# Patient Record
Sex: Female | Born: 2000
Health system: Southern US, Community
[De-identification: ages and names within clinical notes are randomized; demographics above are authoritative.]

## PROBLEM LIST (undated history)

## (undated) DIAGNOSIS — E039 Hypothyroidism, unspecified: Secondary | ICD-10-CM

## (undated) HISTORY — PX: MULTIPLE TOOTH EXTRACTIONS: SHX2053

## (undated) HISTORY — DX: Hypothyroidism, unspecified: E03.9

---
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
## 2020-07-22 DIAGNOSIS — Z111 Encounter for screening for respiratory tuberculosis: Secondary | ICD-10-CM | POA: Diagnosis not present

## 2020-07-24 DIAGNOSIS — Z111 Encounter for screening for respiratory tuberculosis: Secondary | ICD-10-CM | POA: Diagnosis not present

## 2020-08-02 DIAGNOSIS — Z111 Encounter for screening for respiratory tuberculosis: Secondary | ICD-10-CM | POA: Diagnosis not present

## 2020-08-04 DIAGNOSIS — Z111 Encounter for screening for respiratory tuberculosis: Secondary | ICD-10-CM | POA: Diagnosis not present

## 2020-09-30 ENCOUNTER — Ambulatory Visit (INDEPENDENT_AMBULATORY_CARE_PROVIDER_SITE_OTHER): Payer: BC Managed Care – PPO | Admitting: Family Medicine

## 2020-09-30 ENCOUNTER — Other Ambulatory Visit: Payer: Self-pay

## 2020-09-30 ENCOUNTER — Encounter: Payer: Self-pay | Admitting: Family Medicine

## 2020-09-30 VITALS — BP 104/51 | HR 80 | Ht 62.6 in | Wt 111.0 lb

## 2020-09-30 DIAGNOSIS — Z113 Encounter for screening for infections with a predominantly sexual mode of transmission: Secondary | ICD-10-CM | POA: Diagnosis not present

## 2020-09-30 DIAGNOSIS — Z0000 Encounter for general adult medical examination without abnormal findings: Secondary | ICD-10-CM

## 2020-09-30 NOTE — Progress Notes (Signed)
Subjective:     Laura Irwin is a 20 y.o. female and is here for a comprehensive physical exam. The patient reports no problems. Doing well in college. 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  Plays tennis and swims.    Social History   Socioeconomic History  . Marital status: Single    Spouse name: Not on file  . Number of children: Not on file  . Years of education: Not on file  . Highest education level: Not on file  Occupational History  . Occupation: Consulting civil engineer.   Tobacco Use  . Smoking status: Never Smoker  . Smokeless tobacco: Never Used  Substance and Sexual Activity  . Alcohol use: No  . Drug use: No  . Sexual activity: Never  Other Topics Concern  . Not on file  Social History Narrative    Lives with parents and brother. Plays softball. Pitches.  She is with the [employer], 9th grade.      Social Determinants of Health   Financial Resource Strain: Not on file  Food Insecurity: Not on file  Transportation Needs: Not on file  Physical Activity: Not on file  Stress: Not on file  Social Connections: Not on file  Intimate Partner Violence: Not on file   Health Maintenance  Topic Date Due  . HIV Screening  Never done  . COVID-19 Vaccine (3 - Booster for Pfizer series) 07/25/2020  . INFLUENZA VACCINE  12/23/2020  . TETANUS/TDAP  04/28/2022  . HPV VACCINES  Completed  . Hepatitis C Screening  Completed    The following portions of the patient's history were reviewed and updated as appropriate: allergies, current medications, past family history, past medical history, past social history, past surgical history and problem list.  Review of Systems A comprehensive review of systems was negative.   Objective:    BP (!) 104/51   Pulse 80   Ht 5' 2.6" (1.59 m)   Wt 111 lb (50.3 kg)   LMP 09/16/2020 (Approximate)   SpO2 100%   BMI 19.92 kg/m  General appearance: alert, cooperative and appears stated age Head: Normocephalic, without  obvious abnormality, atraumatic Eyes: conj, clear, EOMI, PEERLA Ears: normal TM's and external ear canals both ears Nose: Nares normal. Septum midline. Mucosa normal. No drainage or sinus tenderness. Throat: lips, mucosa, and tongue normal; teeth and gums normal Neck: no adenopathy, no carotid bruit, no JVD, supple, symmetrical, trachea midline and thyroid not enlarged, symmetric, no tenderness/mass/nodules Back: symmetric, no curvature. ROM normal. No CVA tenderness. Lungs: clear to auscultation bilaterally Breasts: normal appearance, no masses or tenderness Heart: regular rate and rhythm, S1, S2 normal, no murmur, click, rub or gallop Abdomen: soft, non-tender; bowel sounds normal; no masses,  no organomegaly Extremities: extremities normal, atraumatic, no cyanosis or edema Pulses: 2+ and symmetric Skin: Skin color, texture, turgor normal. No rashes or lesions Lymph nodes: Cervical, supraclavicular, and axillary nodes normal. Neurologic: Alert and oriented X 3, normal strength and tone. Normal symmetric reflexes. Normal coordination and gait    Assessment:    Healthy female exam.      Plan:     See After Visit Summary for Counseling Recommendations   Keep up a regular exercise program and make sure you are eating a healthy diet Try to eat 4 servings of dairy a day, or if you are lactose intolerant take a calcium with vitamin D daily.  Your vaccines are up to date.  Discussed birth  control options.

## 2020-09-30 NOTE — Patient Instructions (Addendum)

## 2020-10-01 LAB — C. TRACHOMATIS/N. GONORRHOEAE RNA
C. trachomatis RNA, TMA: NOT DETECTED
N. gonorrhoeae RNA, TMA: NOT DETECTED

## 2020-10-07 ENCOUNTER — Other Ambulatory Visit: Payer: Self-pay

## 2020-10-07 ENCOUNTER — Encounter: Payer: Self-pay | Admitting: Family Medicine

## 2020-10-07 ENCOUNTER — Ambulatory Visit (INDEPENDENT_AMBULATORY_CARE_PROVIDER_SITE_OTHER): Payer: BC Managed Care – PPO

## 2020-10-07 ENCOUNTER — Ambulatory Visit: Payer: BC Managed Care – PPO | Admitting: Family Medicine

## 2020-10-07 VITALS — BP 125/68 | HR 67 | Ht 63.0 in | Wt 111.0 lb

## 2020-10-07 DIAGNOSIS — M25562 Pain in left knee: Secondary | ICD-10-CM

## 2020-10-07 NOTE — Progress Notes (Signed)
Acute Office Visit  Subjective:    Patient ID: Laura Irwin, female    DOB: 2000/11/13, 20 y.o.   MRN: [id number]  Chief Complaint  Patient presents with  . Knee Pain    HPI Patient is in today for left knee pain x 1 week. Started last Tuesday.  She said she was running on the treadmill which she usually does for about 15 minutes when she is exercising and suddenly felt a pop.  She it was a little bit uncomfortable but she continued and finished her workout and then later that evening she was playing sand court volleyball and says it kept popping during the play.  She says since then its been more sore and tender and bothersome mostly underneath the kneecap over the patellar tendon area.  She says Friday and Saturday it was actually really painful she wore an old brace soft brace that she had at home.  And she has been trying to elevate it she is been taking Aleve once a day.  Says the knee feels worse if she tries to run on it or if she tries to straighten it she says she feels most like it wants to lock.  .   Past Medical History:  Diagnosis Date  . Hypothyroidism     Past Surgical History:  Procedure Laterality Date  . MULTIPLE TOOTH EXTRACTIONS      Family History  Problem Relation Age of Onset  . Thyroid disease Maternal Grandmother   . Thyroid disease Maternal Aunt     Social History   Socioeconomic History  . Marital status: Single    Spouse name: Not on file  . Number of children: Not on file  . Years of education: Not on file  . Highest education level: Not on file  Occupational History  . Occupation: Consulting civil engineer.   Tobacco Use  . Smoking status: Never Smoker  . Smokeless tobacco: Never Used  Substance and Sexual Activity  . Alcohol use: No  . Drug use: No  . Sexual activity: Never  Other Topics Concern  . Not on file  Social History Narrative    Lives with parents and brother. Plays softball. Pitches.  She is with the [employer], 9th grade.      Social  Determinants of Health   Financial Resource Strain: Not on file  Food Insecurity: Not on file  Transportation Needs: Not on file  Physical Activity: Not on file  Stress: Not on file  Social Connections: Not on file  Intimate Partner Violence: Not on file    Outpatient Medications Prior to Visit  Medication Sig Dispense Refill  . levothyroxine (SYNTHROID) 125 MCG tablet Take 1 tablet (125 mcg total) by mouth daily. 90 tablet 3   No facility-administered medications prior to visit.    Allergies  Allergen Reactions  . Amoxicillin   . Cephalosporins   . Minocycline Rash    Hives    Review of Systems     Objective:    Physical Exam Vitals reviewed.  Constitutional:      Appearance: She is well-developed.  HENT:     Head: Normocephalic and atraumatic.  Eyes:     Conjunctiva/sclera: Conjunctivae normal.  Cardiovascular:     Rate and Rhythm: Normal rate.  Pulmonary:     Effort: Pulmonary effort is normal.  Musculoskeletal:     Comments: Mild swelling below the patella over the patellar tendon are.  Tender rectally over the tenderness.  Nontender over the medial lateral  joint lines.  No increased laxity.  Negative McMurray's.  Strength is 5 out of 5 at the hip knee and ankle.  No increased laxity with varus or valgus stress.  Skin:    General: Skin is dry.     Coloration: Skin is not pale.  Neurological:     Mental Status: She is alert and oriented to person, place, and time.  Psychiatric:        Behavior: Behavior normal.     BP 125/68   Pulse 67   Ht 5\' 3"  (1.6 m)   Wt 111 lb (50.3 kg)   LMP 09/16/2020 (Approximate)   SpO2 98%   BMI 19.66 kg/m  Wt Readings from Last 3 Encounters:  10/07/20 111 lb (50.3 kg) (17 %, Z= -0.94)*  09/30/20 111 lb (50.3 kg) (17 %, Z= -0.94)*  12/25/19 106 lb (48.1 kg) (11 %, Z= -1.22)*   * Growth percentiles are based on CDC (Girls, 2-20 Years) data.    Health Maintenance Due  Topic Date Due  . HIV Screening  Never done  .  COVID-19 Vaccine (3 - Booster for Pfizer series) 06/27/2020    There are no preventive care reminders to display for this patient.   Lab Results  Component Value Date   TSH 3.85 12/15/2019   Lab Results  Component Value Date   WBC 5.1 06/14/2017   HGB 13.3 06/14/2017   HCT 39.5 06/14/2017   MCV 85.9 06/14/2017   PLT 303 06/14/2017   Lab Results  Component Value Date   NA 139 09/25/2011   K 3.6 09/25/2011   CO2 26 09/25/2011   GLUCOSE 67 (L) 09/25/2011   BUN 21 09/25/2011   CREATININE 1.05 09/25/2011   BILITOT 0.7 10/08/2011   ALKPHOS 68 10/08/2011   AST 38 (H) 10/08/2011   ALT 22 10/08/2011   PROT 7.5 10/08/2011   ALBUMIN 4.8 10/08/2011   CALCIUM 9.6 09/25/2011   No results found for: CHOL No results found for: HDL No results found for: LDLCALC No results found for: TRIG No results found for: CHOLHDL Lab Results  Component Value Date   HGBA1C 5.2 10/28/2012       Assessment & Plan:   Problem List Items Addressed This Visit   None   Visit Diagnoses    Acute pain of left knee    -  Primary   Relevant Orders   DG Knee Complete 4 Views Left     Left knee pain-normal range of motion, strength is normal.  No significant crepitus.  No significant laxity of the patella.  We will get x-ray today just to make sure that we do not see anything unusual.  She is never had any problems with this knee before.  Suspect patellar tendinitis.  Given handout on exercises to do on her own at home recommend icing for 5 to 10 minutes 3 times a day.  Increase Aleve to twice a day for the next week.  If not improving then will get in with sports med and or orthopedist.  She can continue to wear the brace especially if it feels more comfortable and supportive.  No orders of the defined types were placed in this encounter.    12/28/2012, MD

## 2020-10-07 NOTE — Patient Instructions (Signed)
Take Aleve twice a day for one week.   Ice for 5-10 min up to 3 times a day.   Work on exercises as tolerated.

## 2020-10-07 NOTE — Progress Notes (Signed)
Pt state that on Tuesday she was running on the treadmill for about 20 mins and her L knee began to hurt but she continued to finish her workout. Later that evening she played sand volleyball and her L Knee continued to pop and bother her. She has been taking Advil and using a soft brace for comfort.

## 2021-01-10 ENCOUNTER — Telehealth (INDEPENDENT_AMBULATORY_CARE_PROVIDER_SITE_OTHER): Payer: Self-pay | Admitting: Pediatric Endocrinology

## 2021-01-10 DIAGNOSIS — E063 Autoimmune thyroiditis: Secondary | ICD-10-CM

## 2021-01-10 NOTE — Telephone Encounter (Signed)
  Who's calling (name and relationship to patient) : Laura Irwin - self  Best contact number: [phone]  Provider they see: Dr. Vanessa Wilton  Reason for call: Patient has appointment next week and wants to make sure that her lab orders are placed for [facility].     PRESCRIPTION REFILL ONLY  Name of prescription:  Pharmacy:

## 2021-01-11 ENCOUNTER — Encounter (INDEPENDENT_AMBULATORY_CARE_PROVIDER_SITE_OTHER): Payer: Self-pay

## 2021-01-13 ENCOUNTER — Ambulatory Visit (INDEPENDENT_AMBULATORY_CARE_PROVIDER_SITE_OTHER): Payer: BC Managed Care – PPO | Admitting: Pediatric Endocrinology

## 2021-01-13 NOTE — Telephone Encounter (Signed)
TSH, free T4, Total T4. Thanks!

## 2021-01-13 NOTE — Telephone Encounter (Signed)
Labs ordered and a message has been sent via Mychart.

## 2021-01-14 DIAGNOSIS — E063 Autoimmune thyroiditis: Secondary | ICD-10-CM | POA: Diagnosis not present

## 2021-01-15 LAB — T4, FREE: Free T4: 1.7 ng/dL — ABNORMAL HIGH (ref 0.8–1.4)

## 2021-01-15 LAB — T4: T4, Total: 10.9 ug/dL (ref 5.3–11.7)

## 2021-01-15 LAB — TSH: TSH: 2.37 mIU/L

## 2021-01-16 ENCOUNTER — Encounter (INDEPENDENT_AMBULATORY_CARE_PROVIDER_SITE_OTHER): Payer: Self-pay | Admitting: Pediatric Endocrinology

## 2021-01-16 ENCOUNTER — Ambulatory Visit (INDEPENDENT_AMBULATORY_CARE_PROVIDER_SITE_OTHER): Payer: BC Managed Care – PPO | Admitting: Pediatric Endocrinology

## 2021-01-16 ENCOUNTER — Other Ambulatory Visit: Payer: Self-pay

## 2021-01-16 VITALS — BP 102/60 | HR 78 | Wt 106.2 lb

## 2021-01-16 DIAGNOSIS — E063 Autoimmune thyroiditis: Secondary | ICD-10-CM

## 2021-01-16 MED ORDER — LEVOTHYROXINE SODIUM 125 MCG PO TABS: 125.0000 ug | ORAL_TABLET | Freq: Every day | ORAL | 3 refills | Status: DC

## 2021-01-16 NOTE — Progress Notes (Signed)
Subjective:  Subjective  Patient Name: Laura Irwin Date of Birth: 12/02/2000  MRN: [id number]  Laura Irwin  presents to the office today for follow-up evaluation and management of her  hypothyroidism and growth delay.  HISTORY OF PRESENT ILLNESS:   Laura Irwin is a 20 y.o. Caucasian female.  Laura Irwin was unaccompanied  1. Laura Irwin was diagnosed with hypothyroidism in May 2013 after presenting with vomiting, jaundice, and swelling of her neck and face, found to have TSH elevated >1033mIU/mL. At that time, she was started on Synthroid at that time. Her TPO antibodies were positive at 92.2. Laura Irwin had slowing of linear growth since age 36.    2. The patient's last [facility] visit was on 12/25/19 . In the interim, she has been generally healthy.    She has finished her first year at [employer]. 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   She continues on Synthroid 125 mcg daily. She denies missing doses. She usually takes it in the morning. She takes her vitamins at night.   She has continued with regular periods.   No changes with hair or skin.  No issues with diarrhea or constipation.  She is always cold. - not as bad but thinks that she will always just be cold.  Weight has been stable.   She is no longer receiving name brand Synthroid. She is taking generic levothyroxine.   She is no longer taking OCP.  She didn't like how it made her feel.   3. Pertinent Review of Systems:  Constitutional: The patient feels "good". The patient seems healthy and active. Eyes: Vision seems to be good. There are no recognized eye problems. Wears glasses to see the board at school- is wearing them more often.  Neck: The patient has no complaints of anterior neck swelling, soreness, tenderness, pressure, discomfort, or difficulty swallowing.   Heart: Heart rate increases with exercise or other physical activity. The patient has no  complaints of palpitations, irregular heart beats, chest pain, or chest pressure.   Gastrointestinal: Has problems with constipation, using Miralax irregularly. The patient has no complaints of excessive hunger, acid reflux, upset stomach, or diarrhea.  Legs: Muscle mass and strength seem normal. There are no complaints of numbness, tingling, burning, or pain. No edema is noted.  Feet: There are no obvious foot problems. There are no complaints of numbness, tingling, burning, or pain. No edema is noted. Neurologic: There are no recognized problems with muscle movement and strength, sensation, or coordination. GYN/GU: per HPI. LMP 8/20  PAST MEDICAL, FAMILY, AND SOCIAL HISTORY  Past Medical History:  Diagnosis Date   Hypothyroidism     Family History  Problem Relation Age of Onset   Thyroid disease Maternal Grandmother    Thyroid disease Maternal Aunt      Current Outpatient Medications:    levothyroxine (SYNTHROID) 125 MCG tablet, Take 1 tablet (125 mcg total) by mouth daily., Disp: 90 tablet, Rfl: 3  Allergies as of 01/16/2021 - Review Complete 01/16/2021  Allergen Reaction Noted   Amoxicillin     Cephalosporins     Minocycline Rash 06/09/2016     reports that she has never smoked. She has never used smokeless tobacco. She reports that she does not drink alcohol and does not use drugs. Pediatric History  Patient Parents   Marik,[name]   Other Topics Concern   Not on file  Social History Narrative  Lives with parents and brother.    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  Life guard and tennis Primary Care Provider: Agapito Games, MD  ROS: There are no other significant problems involving Laura Irwin's other body systems.    Objective:  Objective  Vital Signs:   BP 102/60   Pulse 78   Wt 106 lb 3.2 oz (48.2 kg)   BMI 18.81 kg/m  Blood pressure percentiles are not available for patients who are 18 years or  older.   Ht Readings from Last 3 Encounters:  10/07/20 5\' 3"  (1.6 m) (31 %, Z= -0.51)*  09/30/20 5' 2.6" (1.59 m) (25 %, Z= -0.66)*  06/21/19 5' 2.99" (1.6 m) (31 %, Z= -0.49)*   * Growth percentiles are based on CDC (Girls, 2-20 Years) data.   Wt Readings from Last 3 Encounters:  01/16/21 106 lb 3.2 oz (48.2 kg) (10 %, Z= -1.31)*  10/07/20 111 lb (50.3 kg) (17 %, Z= -0.94)*  09/30/20 111 lb (50.3 kg) (17 %, Z= -0.94)*   * Growth percentiles are based on CDC (Girls, 2-20 Years) data.   HC Readings from Last 3 Encounters:  No data found for Laura Irwin   Body surface area is 1.46 meters squared. No height on file for this encounter. 10 %ile (Z= -1.31) based on CDC (Girls, 2-20 Years) weight-for-age data using vitals from 01/16/2021.   PHYSICAL EXAM:  Constitutional: The patient appears healthy and well nourished. The patient's height and weight are normal. Weight is down 5 pounds.  Head: The head is normocephalic. Face: The face appears normal. There are no obvious dysmorphic features. Eyes: The eyes appear to be normally formed and spaced. Gaze is conjugate. There is no obvious arcus or proptosis. Moisture appears normal. Ears: The ears are normally placed and appear externally normal. Mouth: The oropharynx and tongue appear normal. Dentition appears to be delayed for age. Oral moisture is normal. Neck: The neck appears to be visibly normal. The consistency of the thyroid gland is normal. The thyroid gland is not tender to palpation. Lungs: The lungs are clear to auscultation. Air movement is good. Heart: Heart rate and rhythm are regular. Heart sounds S1 and S2 are normal. I did not appreciate any pathologic cardiac murmurs. Abdomen: The abdomen appears to be normal in size for the patient's age. Bowel sounds are normal. There is no obvious hepatomegaly, splenomegaly, or other mass effect.  Arms: Muscle size and bulk are normal for age. Hypopigmented area on right wrist. Hands: There is no  obvious tremor. Phalangeal and metacarpophalangeal joints are normal. Palmar muscles are normal for age. Palmar skin is normal. Palmar moisture is also normal. Legs: Muscles appear normal for age. No edema is present. Feet: Feet are normally formed. Dorsalis pedal pulses are normal. Neurologic: Strength is normal for age in both the upper and lower extremities. Muscle tone is normal. Sensation to touch is normal in both the legs and feet.   GYN/GU: Normal.   LAB DATA:    Results for orders placed or performed in visit on 01/10/21  T4, free  Result Value Ref Range   Free T4 1.7 (H) 0.8 - 1.4 ng/dL  TSH  Result Value Ref Range   TSH 2.37 mIU/L  T4  Result Value Ref Range   T4, Total 10.9 5.3 - 11.7 mcg/dL       Assessment and Plan:   ASSESSMENT: Starlee is a 20 y.o. female referred  for growth delay and hypothyroidism.   Hypothyroidism - Clinically euthyroid on 125 of levothyroxine daily - Chemically appears slightly over treated with normal TSH and just above normal free T4 - as she is clinically not hyperthyroid and labs are stable from last year will continue current dose  PLAN:  1. Diagnostic: Labs as above. Repeat TFTs prior to next visit  2. Therapeutic: Continue levothyroxine 125 daily.  3. Patient education:  Discussion as above.   4. Follow-up: Return in about 1 year (around 01/16/2022).      Dessa Phi, MD  Level of Service: Level 3

## 2021-09-18 DIAGNOSIS — Z2089 Contact with and (suspected) exposure to other communicable diseases: Secondary | ICD-10-CM | POA: Diagnosis not present

## 2021-10-07 ENCOUNTER — Encounter: Payer: BC Managed Care – PPO | Admitting: Family Medicine

## 2021-10-10 ENCOUNTER — Telehealth (INDEPENDENT_AMBULATORY_CARE_PROVIDER_SITE_OTHER): Payer: Self-pay | Admitting: Pediatric Endocrinology

## 2021-10-10 DIAGNOSIS — E063 Autoimmune thyroiditis: Secondary | ICD-10-CM

## 2021-10-10 NOTE — Telephone Encounter (Signed)
Spoke with Laura Irwin  She is having sweating, anxiety, palpitations, trouble sleeping.  Will order repeat thyroid labs Will have a virtual visit early next week to review results and treat if indicated.   Lelon Huh, MD

## 2021-10-10 NOTE — Telephone Encounter (Signed)
  Name of who is calling:Selkirk   Caller's Relationship to Patient:Self   Best contact number:[phone]  Provider they see:Dr.Badik   Reason for call:Caller stated that she has not been feeling well and that she is having a lot of anxiety and feeling on edge. She stated that it is interfering with school and sleep. Eola has requested a call back to discuss this and to see if orders could be sent to the [facility] in [place] for her labs to be taken and to also have her thyroid checked. Please advise     PRESCRIPTION REFILL ONLY  Name of prescription:  Pharmacy:

## 2021-10-13 ENCOUNTER — Telehealth (INDEPENDENT_AMBULATORY_CARE_PROVIDER_SITE_OTHER): Payer: Self-pay

## 2021-10-13 ENCOUNTER — Encounter: Payer: BC Managed Care – PPO | Admitting: Family Medicine

## 2021-10-13 NOTE — Telephone Encounter (Signed)
  Name of who is calling: Tracey  Caller's Relationship to Patient: Mother  Best contact number: [phone] (Mother) [phone] Normand Sloop)  Provider they see: Baldo Ash  Reason for call: Patient mother called to see if patient had to wait on med adjustment for appt in June as she is in nursing school classes all day and that was the first available to fit her school schedule. Patient is still feeling bad.     PRESCRIPTION REFILL ONLY  Name of prescription:  Pharmacy:

## 2021-10-15 LAB — COMPREHENSIVE METABOLIC PANEL
AG Ratio: 1.8 (calc) (ref 1.0–2.5)
ALT: 15 U/L (ref 6–29)
AST: 14 U/L (ref 10–30)
Albumin: 4.6 g/dL (ref 3.6–5.1)
Alkaline phosphatase (APISO): 50 U/L (ref 31–125)
BUN: 17 mg/dL (ref 7–25)
CO2: 27 mmol/L (ref 20–32)
Calcium: 9.7 mg/dL (ref 8.6–10.2)
Chloride: 105 mmol/L (ref 98–110)
Creat: 0.93 mg/dL (ref 0.50–0.96)
Globulin: 2.6 g/dL (calc) (ref 1.9–3.7)
Glucose, Bld: 54 mg/dL — ABNORMAL LOW (ref 65–139)
Potassium: 3.9 mmol/L (ref 3.5–5.3)
Sodium: 141 mmol/L (ref 135–146)
Total Bilirubin: 0.9 mg/dL (ref 0.2–1.2)
Total Protein: 7.2 g/dL (ref 6.1–8.1)

## 2021-10-15 LAB — CBC WITH DIFFERENTIAL/PLATELET
Absolute Monocytes: 442 cells/uL (ref 200–950)
Basophils Absolute: 27 cells/uL (ref 0–200)
Basophils Relative: 0.4 %
Eosinophils Absolute: 147 cells/uL (ref 15–500)
Eosinophils Relative: 2.2 %
HCT: 41.8 % (ref 35.0–45.0)
Hemoglobin: 13.9 g/dL (ref 11.7–15.5)
Lymphs Abs: 2760 cells/uL (ref 850–3900)
MCH: 29.3 pg (ref 27.0–33.0)
MCHC: 33.3 g/dL (ref 32.0–36.0)
MCV: 88 fL (ref 80.0–100.0)
MPV: 12.3 fL (ref 7.5–12.5)
Monocytes Relative: 6.6 %
Neutro Abs: 3323 cells/uL (ref 1500–7800)
Neutrophils Relative %: 49.6 %
Platelets: 301 10*3/uL (ref 140–400)
RBC: 4.75 10*6/uL (ref 3.80–5.10)
RDW: 12 % (ref 11.0–15.0)
Total Lymphocyte: 41.2 %
WBC: 6.7 10*3/uL (ref 3.8–10.8)

## 2021-10-15 LAB — T4, FREE: Free T4: 1.5 ng/dL — ABNORMAL HIGH (ref 0.8–1.4)

## 2021-10-15 LAB — TSH: TSH: 8.04 mIU/L — ABNORMAL HIGH

## 2021-10-15 LAB — THYROID STIMULATING IMMUNOGLOBULIN: TSI: <89 % baseline (ref <140)

## 2021-10-15 LAB — T3: T3, Total: 100 ng/dL (ref 86–192)

## 2021-10-23 ENCOUNTER — Telehealth (INDEPENDENT_AMBULATORY_CARE_PROVIDER_SITE_OTHER): Payer: BC Managed Care – PPO | Admitting: Pediatric Endocrinology

## 2021-10-23 ENCOUNTER — Encounter (INDEPENDENT_AMBULATORY_CARE_PROVIDER_SITE_OTHER): Payer: Self-pay | Admitting: Pediatric Endocrinology

## 2021-10-23 DIAGNOSIS — E063 Autoimmune thyroiditis: Secondary | ICD-10-CM

## 2021-10-23 DIAGNOSIS — F4322 Adjustment disorder with anxiety: Secondary | ICD-10-CM

## 2021-10-23 MED ORDER — HYDROXYZINE HCL 10 MG PO TABS: 10.0000 mg | ORAL_TABLET | Freq: Three times a day (TID) | ORAL | 0 refills | Status: DC | PRN

## 2021-10-23 NOTE — Progress Notes (Signed)
This is a Pediatric Specialist E-Visit consult/follow up provided via My Chart Laura Irwin consented to an E-Visit consult today.  Location of patient: Laura Irwin is at home Location of provider: Koren ShiverJennifer Azani Brogdon,MD is at [facility] Patient was referred by Agapito GamesMetheney, Catherine D, *   The following participants were involved in this E-Visit: Laura SchimkeEllie Casher, Da'Shaunia Ridenhour, CMA and Dessa PhiJennifer Estefany Goebel, MD  This visit was done via VIDEO   Chief Complain/ Reason for E-Visit today: hypothyroidism and anxiety Total time on call: 27 minutes Follow up: 3 months (already scheduled)    Subjective:  Subjective  Patient Name: Laura Irwin Date of Birth: July 01, 2000  MRN: [id number]  Laura Irwin  presents to the office today for follow-up evaluation and management of her  hypothyroidism and growth delay.  HISTORY OF PRESENT ILLNESS:   Laura Irwin is a 21 y.o. Caucasian female.  Keandra was unaccompanied  1. Laura Irwin was diagnosed with hypothyroidism in May 2013 after presenting with vomiting, jaundice, and swelling of her neck and face, found to have TSH elevated >105500mIU/mL. At that time, she was started on Synthroid at that time. Her TPO antibodies were positive at 92.2.   2. Laura Irwin's last [facility] visit was on 01/16/21. In the interim, she started nursing school a few weeks ago. Starting this new program has caused her to see a sharp increase in anxiety symptoms. She is having trouble focusing on her work. She is feeling that she is not sleeping well. She initially thought that these symptoms were due to her thyroid. However, her thyroid labs were (if anything) hypothyroid with elevation in her TSH to 8. Her free T4 has remained slightly elevated (as it always is) at 1.5.   She denies any changes to how she is taking her medication. She is getting the same generic. She is taking it at the same time of day. She does not take any other medications at the same time as her levothyroxine. She waits 30 minutes to eat  after she has taken her medication.   She now thinks that the anxiety is secondary to starting a new program and her fears that she might not be up to the task.  She has finished her first year at [employer]. 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   She continues on Synthroid 125 mcg daily. She denies missing doses. She usually takes it in the morning. She takes her vitamins at night.   She has continued with regular periods.   No changes with hair or skin.  No issues with diarrhea or constipation.  She is always cold. - not as bad but thinks that she will always just be cold.  She thinks that she has lost weight due to poor intake.    3. Pertinent Review of Systems:  Constitutional: The patient feels "okay". The patient seems healthy and active. Eyes: Vision seems to be good. There are no recognized eye problems. Wears glasses to see the board at school- is wearing them more often.  Neck: The patient has no complaints of anterior neck swelling, soreness, tenderness, pressure, discomfort, or difficulty swallowing.   Heart: Heart rate increases with exercise or other physical activity. The patient has no complaints of palpitations, irregular heart beats, chest pain, or chest pressure.   Gastrointestinal: Has problems with constipation, using Miralax irregularly. The patient has no complaints of excessive hunger, acid reflux, upset stomach, or  diarrhea.  Legs: Muscle mass and strength seem normal. There are no complaints of numbness, tingling, burning, or pain. No edema is noted.  Feet: There are no obvious foot problems. There are no complaints of numbness, tingling, burning, or pain. No edema is noted. Neurologic: There are no recognized problems with muscle movement and strength, sensation, or coordination. GYN/GU: per HPI.   PAST MEDICAL, FAMILY, AND SOCIAL HISTORY  Past Medical History:   Diagnosis Date   Hypothyroidism     Family History  Problem Relation Age of Onset   Thyroid disease Maternal Grandmother    Thyroid disease Maternal Aunt      Current Outpatient Medications:    hydrOXYzine (ATARAX) 10 MG tablet, Take 1 tablet (10 mg total) by mouth 3 (three) times daily as needed for anxiety., Disp: 30 tablet, Rfl: 0   levothyroxine (SYNTHROID) 125 MCG tablet, Take 1 tablet (125 mcg total) by mouth daily., Disp: 90 tablet, Rfl: 3  Allergies as of 10/23/2021 - Review Complete 10/23/2021  Allergen Reaction Noted   Amoxicillin     Cephalosporins     Penicillins  10/23/2021   Minocycline Rash 06/09/2016     reports that she has never smoked. She has never used smokeless tobacco. She reports that she does not drink alcohol and does not use drugs. Pediatric History  Patient Parents   Laura Irwin (Mother)   Other Topics Concern   Not on file  Social History Narrative    Lives with parents and brother.    Goes to [employer] and tennis Primary Care Provider: Agapito Games, MD  ROS: There are no other significant problems involving Laura Irwin's other body systems.    Objective:  Objective  Vital Signs:   BP has been running 110/70s   Pulse is between 80-86  There were no vitals taken for this visit. Growth percentile SmartLinks can only be used for patients less than 46 years old.   Ht Readings from Last 3 Encounters:  10/07/20 5\' 3"  (1.6 m) (31 %, Z= -0.51)*  09/30/20 5' 2.6" (1.59 m) (25 %, Z= -0.66)*  06/21/19 5' 2.99" (1.6 m) (31 %, Z= -0.49)*   * Growth percentiles are based on CDC (Girls, 2-20 Years) data.   Wt Readings from Last 3 Encounters:  01/16/21 106 lb 3.2 oz (48.2 kg) (10 %, Z= -1.31)*  10/07/20 111 lb (50.3 kg) (17 %, Z= -0.94)*  09/30/20 111 lb (50.3 kg) (17 %, Z= -0.94)*   * Growth percentiles are based on CDC (Girls, 2-20 Years) data.   HC Readings from Last 3 Encounters:  No data found for [facility]    There is no height or weight on file to calculate BSA. Facility age limit for growth percentiles is 20 years. Facility age limit for growth percentiles is 20 years.   PHYSICAL EXAM:   Virtual Visit  Gen- no distress Eyes- sclera clear. No proptosis Nares- no nasal discharge Mouth- MMM Neck- no visible goiter Lungs- No increased work of breathing Extremities- mild baseline tremor Psych- appropriate  LAB DATA:    Lab Results  Component Value Date   TSH 8.04 (H) 10/10/2021   TSH 2.37 01/14/2021   TSH 3.85 12/15/2019   TSH 0.67 12/14/2018   TSH 0.56 11/11/2017   TSH 1.07 06/14/2017   Lab Results  Component Value Date   FREET4 1.5 (H) 10/10/2021   FREET4 1.7 (H) 01/14/2021   FREET4 1.7 (H) 12/15/2019   FREET4 1.6 (H)  12/14/2018   FREET4 1.5 (H) 11/11/2017   FREET4 1.7 (H) 06/14/2017     Results for orders placed or performed in visit on 10/10/21  Thyroid stimulating immunoglobulin  Result Value Ref Range   TSI <89 <140 % baseline  TSH  Result Value Ref Range   TSH 8.04 (H) mIU/L  T4, free  Result Value Ref Range   Free T4 1.5 (H) 0.8 - 1.4 ng/dL  Comprehensive metabolic panel  Result Value Ref Range   Glucose, Bld 54 (L) 65 - 139 mg/dL   BUN 17 7 - 25 mg/dL   Creat 8.58 8.50 - 2.77 mg/dL   BUN/Creatinine Ratio NOT APPLICABLE 6 - 22 (calc)   Sodium 141 135 - 146 mmol/L   Potassium 3.9 3.5 - 5.3 mmol/L   Chloride 105 98 - 110 mmol/L   CO2 27 20 - 32 mmol/L   Calcium 9.7 8.6 - 10.2 mg/dL   Total Protein 7.2 6.1 - 8.1 g/dL   Albumin 4.6 3.6 - 5.1 g/dL   Globulin 2.6 1.9 - 3.7 g/dL (calc)   AG Ratio 1.8 1.0 - 2.5 (calc)   Total Bilirubin 0.9 0.2 - 1.2 mg/dL   Alkaline phosphatase (APISO) 50 31 - 125 U/L   AST 14 10 - 30 U/L   ALT 15 6 - 29 U/L  CBC with Differential/Platelet  Result Value Ref Range   WBC 6.7 3.8 - 10.8 Thousand/uL   RBC 4.75 3.80 - 5.10 Million/uL   Hemoglobin 13.9 11.7 - 15.5 g/dL   HCT 41.2 87.8 - 67.6 %   MCV 88.0 80.0 - 100.0  fL   MCH 29.3 27.0 - 33.0 pg   MCHC 33.3 32.0 - 36.0 g/dL   RDW 72.0 94.7 - 09.6 %   Platelets 301 140 - 400 Thousand/uL   MPV 12.3 7.5 - 12.5 fL   Neutro Abs 3,323 1,500 - 7,800 cells/uL   Lymphs Abs 2,760 850 - 3,900 cells/uL   Absolute Monocytes 442 200 - 950 cells/uL   Eosinophils Absolute 147 15 - 500 cells/uL   Basophils Absolute 27 0 - 200 cells/uL   Neutrophils Relative % 49.6 %   Total Lymphocyte 41.2 %   Monocytes Relative 6.6 %   Eosinophils Relative 2.2 %   Basophils Relative 0.4 %  T3  Result Value Ref Range   T3, Total 100 86 - 192 ng/dL       Assessment and Plan:   ASSESSMENT: Nitika is a 21 y.o. female referred for growth delay and hypothyroidism.   Hypothyroidism - Clinically euthyroid on 125 of levothyroxine daily - Chemically appears slightly under treated with increase in her TSH - although her free T4 remains above target - will increase LT4 by 1/2 tab (62.5 mcg) per week or 9 mcg per day    Anxiety - Will start Atarax 10mg  PRN - May take 2 for sleep - May take 1/2 tab if a full tab is too sedating.   PLAN:  1. Diagnostic: Labs as above. Repeat TFTs prior to next visit  2. Therapeutic: Continue levothyroxine 125 daily. Add 0.5 tab extra per week. Atarax as above 3. Patient education:  Discussion as above.   4. Follow-up: Return in about 3 months (around 01/23/2022).      03/25/2022, MD  Level of Service: >40 minutes spent today reviewing the medical chart, counseling the patient/family, and documenting today's encounter.

## 2021-11-06 ENCOUNTER — Encounter: Payer: Self-pay | Admitting: Family Medicine

## 2021-11-06 ENCOUNTER — Ambulatory Visit: Payer: BC Managed Care – PPO | Admitting: Family Medicine

## 2021-11-06 VITALS — BP 108/62 | HR 76 | Ht 63.0 in | Wt 108.0 lb

## 2021-11-06 DIAGNOSIS — F418 Other specified anxiety disorders: Secondary | ICD-10-CM | POA: Diagnosis not present

## 2021-11-06 NOTE — Progress Notes (Signed)
Pt is doing well on current treatment. She has been taking for 4 weeks now. She said that it does help with the Anxiety when she does take it.   She reports that she takes 1 Hydroxyzine PRN, and if she has trouble sleeping told to take 2.

## 2021-11-06 NOTE — Assessment & Plan Note (Signed)
We discussed options.  Right now she is just using as needed Atarax which is perfectly fine we discussed that it is a safe medication it acts as a sedative.  And is not habit-forming.  Again she has used it very infrequently.  We did discuss some strategies around school.  It sounds like she took a pretty heavy load over those 3 weeks so just taking something that really is manageable to give her time to really unwind and decompress and exercise and take care of herself and spend time with friends.  We also discussed that if symptoms seem to really ramp up in the fall to please reach out.  We can connect her with a therapist or counselor who can help her around some strategies for anxiety and stress.  We can also look at other options for medication if needed.  The right now she is not currently experiencing a generalized anxiety.  We also discussed the importance of making sure she is getting regular sleep and working on ways to decompress daily.

## 2021-11-06 NOTE — Progress Notes (Signed)
Established Patient Office Visit  Subjective   Patient ID: Laura Irwin, female    DOB: [birth year]  Age: 21 y.o. MRN: [id number]  Chief Complaint  Patient presents with   Anxiety    HPI  Pt is here to discuss recent anxiety symptoms that started about 6 weeks ago.  She was given a prescription for Atarax about 4 weeks ago.  She said that it does help with the Anxiety when she does take it.  She is really only used it a handful of times.  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  She says it was a lot she was spending 8 to 9 hours a day in class and then having to do lots of homework for hours afterwards and just started to feel a daily sensation of anxiety she felt like there was a ball in the pit of her stomach daily.  And she was having difficulty with sleep.  She is now finished the classes and does not have any more classes until August and so right now she feels great she does not feel like there is any increase just her anxiety around any other parts of her life except for school.  She says her thyroid levels were off a little bit around that time and her endocrinologist called in a prescription for Atarax for her to use as needed.   She reports that she takes 1 Hydroxyzine PRN, and if she has trouble sleeping told to take 2.     ROS    Objective:     BP 108/62   Pulse 76   Ht 5\' 3"  (1.6 m)   Wt 108 lb (49 kg)   LMP 10/27/2021 (Exact Date)   SpO2 100%   BMI 19.13 kg/m    Physical Exam Vitals reviewed.  Constitutional:      Appearance: She is well-developed.  HENT:     Head: Normocephalic and atraumatic.  Eyes:     Conjunctiva/sclera: Conjunctivae normal.  Cardiovascular:     Rate and Rhythm: Normal rate.  Pulmonary:     Effort: Pulmonary effort is normal.  Skin:    General: Skin is dry.     Coloration: Skin is not pale.  Neurological:     Mental Status: She is alert and  oriented to person, place, and time.  Psychiatric:        Behavior: Behavior normal.      No results found for any visits on 11/06/21.    The ASCVD Risk score (Arnett DK, et al., 2019) failed to calculate for the following reasons:   The 2019 ASCVD risk score is only valid for ages 48 to 13    Assessment & Plan:   Problem List Items Addressed This Visit       Other   Situational anxiety - Primary    We discussed options.  Right now she is just using as needed Atarax which is perfectly fine we discussed that it is a safe medication it acts as a sedative.  And is not habit-forming.  Again she has used it very infrequently.  We did discuss some strategies around school.  It sounds like she took a pretty heavy load over those 3 weeks so just taking something that really is manageable to give her time to really unwind and decompress and exercise and take care of herself and spend time with friends.  We also discussed that if symptoms seem to really ramp up in the fall to please reach out.  We can connect her with a therapist or counselor who can help her around some strategies for anxiety and stress.  We can also look at other options for medication if needed.  The right now she is not currently experiencing a generalized anxiety.  We also discussed the importance of making sure she is getting regular sleep and working on ways to decompress daily.       Return if symptoms worsen or fail to improve.   I spent 21 minutes on the day of the encounter to include pre-visit record review, face-to-face time with the patient and post visit ordering of test.   Nani Gasser, MD

## 2021-12-12 ENCOUNTER — Other Ambulatory Visit (INDEPENDENT_AMBULATORY_CARE_PROVIDER_SITE_OTHER): Payer: Self-pay

## 2021-12-12 ENCOUNTER — Encounter (INDEPENDENT_AMBULATORY_CARE_PROVIDER_SITE_OTHER): Payer: Self-pay | Admitting: Pediatric Endocrinology

## 2021-12-12 MED ORDER — LEVOTHYROXINE SODIUM 125 MCG PO TABS: 125.0000 ug | ORAL_TABLET | Freq: Every day | ORAL | 4 refills | Status: DC

## 2021-12-24 ENCOUNTER — Encounter (INDEPENDENT_AMBULATORY_CARE_PROVIDER_SITE_OTHER): Payer: Self-pay

## 2022-01-14 ENCOUNTER — Telehealth (INDEPENDENT_AMBULATORY_CARE_PROVIDER_SITE_OTHER): Payer: Self-pay | Admitting: Pediatric Endocrinology

## 2022-01-14 DIAGNOSIS — E063 Autoimmune thyroiditis: Secondary | ICD-10-CM

## 2022-01-14 NOTE — Telephone Encounter (Signed)
  Name of who is calling: Teriann  Caller's Relationship to Patient: self  Best contact number: [phone]  Provider they see: Dr. Vanessa Nice  Reason for call: Pt is calling asking when she needs to get her blood work done. She has an appt on Wednesday 8-30.

## 2022-01-14 NOTE — Telephone Encounter (Signed)
Spoke to pt and advise her to come a few days before her appt to have labs done. Her schedule did not permit her to be able to come to our lab, so I gave her the  info for [facility] lab location on [address] She stated understanding and had no follow up questions at this time.   Address: [address], [place], [place] [address] Hours:  Closes soon ? 5?PM ? Opens 7?AM Thu Phone: [phone]

## 2022-01-19 DIAGNOSIS — E063 Autoimmune thyroiditis: Secondary | ICD-10-CM | POA: Diagnosis not present

## 2022-01-20 ENCOUNTER — Ambulatory Visit (INDEPENDENT_AMBULATORY_CARE_PROVIDER_SITE_OTHER): Payer: BC Managed Care – PPO | Admitting: Pediatric Endocrinology

## 2022-01-20 LAB — TSH: TSH: 3.34 mIU/L

## 2022-01-20 LAB — T4, FREE: Free T4: 1.5 ng/dL — ABNORMAL HIGH (ref 0.8–1.4)

## 2022-01-21 ENCOUNTER — Ambulatory Visit (INDEPENDENT_AMBULATORY_CARE_PROVIDER_SITE_OTHER): Payer: BC Managed Care – PPO | Admitting: Pediatric Endocrinology

## 2022-01-21 ENCOUNTER — Encounter (INDEPENDENT_AMBULATORY_CARE_PROVIDER_SITE_OTHER): Payer: Self-pay | Admitting: Pediatric Endocrinology

## 2022-01-21 VITALS — BP 104/60 | HR 84 | Wt 111.5 lb

## 2022-01-21 DIAGNOSIS — E039 Hypothyroidism, unspecified: Secondary | ICD-10-CM

## 2022-01-21 DIAGNOSIS — E063 Autoimmune thyroiditis: Secondary | ICD-10-CM

## 2022-01-21 NOTE — Progress Notes (Signed)
Subjective:  Subjective  Patient Name: Laura Irwin Date of Birth: 2000-08-01  MRN: [id number]  Laura Irwin  presents to the office today for follow-up evaluation and management of her  hypothyroidism and growth delay.  HISTORY OF PRESENT ILLNESS:   Laura Irwin is a 21 y.o. Caucasian female.  Laura Irwin was unaccompanied  1. Laura Irwin was diagnosed with hypothyroidism in May 2013 after presenting with vomiting, jaundice, and swelling of her neck and face, found to have TSH elevated >1020mIU/mL. At that time, she was started on Synthroid at that time. Her TPO antibodies were positive at 92.2.   2. Laura Irwin's last [facility] visit was on 10/23/21 (Virtual) In the interim, she has been doing well. 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   She has been taking 125 mcg of Synthroid daily. She is taking an extra 1/2 tab on Fridays. She feels that this is working well for her. She is not having any issues sleeping over the weekend.   Anxiety has been better. She is taking atarax at night. She finds that she feels much better the following day. She is packing her lunch and making her breakfast the night before. She feels that now that she is sleeping and eating she is doing much better.    She has had longer intervals between periods for the past 4 months. Her last period was 9 days late.    No changes with hair or skin.  No issues with diarrhea or constipation.  She is always cold. - not as bad but thinks that she will always just be cold.    3. Pertinent Review of Systems:  Constitutional: The patient feels "good". The patient seems healthy and active. Eyes: Vision seems to be good. There are no recognized eye problems. Wears glasses to see the board at school- is wearing them more often.  Neck: The patient has no complaints of anterior neck swelling, soreness, tenderness, pressure, discomfort, or difficulty swallowing.   Heart: Heart rate increases with exercise or other physical  activity. The patient has no complaints of palpitations, irregular heart beats, chest pain, or chest pressure.   Gastrointestinal: Has problems with constipation, using Miralax irregularly. The patient has no complaints of excessive hunger, acid reflux, upset stomach, or diarrhea.  Legs: Muscle mass and strength seem normal. There are no complaints of numbness, tingling, burning, or pain. No edema is noted.  Feet: There are no obvious foot problems. There are no complaints of numbness, tingling, burning, or pain. No edema is noted. Neurologic: There are no recognized problems with muscle movement and strength, sensation, or coordination. GYN/GU: per HPI.   PAST MEDICAL, FAMILY, AND SOCIAL HISTORY  Past Medical History:  Diagnosis Date   Hypothyroidism     Family History  Problem Relation Age of Onset   Thyroid disease Maternal Grandmother    Thyroid disease Maternal Aunt      Current Outpatient Medications:    hydrOXYzine (ATARAX) 10 MG tablet, Take 1 tablet (10 mg total) by mouth 3 (three) times daily as needed for anxiety., Disp: 30 tablet, Rfl: 0   levothyroxine (SYNTHROID) 125 MCG tablet, Take 1 tablet (125 mcg total) by mouth daily. Taking 1 tablet 6 days of the week and 1.5 tablets on Friday., Disp: 36 tablet, Rfl: 4  Allergies as of 01/21/2022 - Review Complete 01/21/2022  Allergen Reaction Noted   Amoxicillin     Cephalosporins     Penicillins  10/23/2021   Minocycline Rash 06/09/2016  reports that she has never smoked. She has never used smokeless tobacco. She reports that she does not drink alcohol and does not use drugs. Pediatric History  Patient Parents   Hornstein,[name]   Other Topics Concern   Not on file  Social History Narrative    Lives with parents and brother.    Goes to [employer] Primary Care Provider: Agapito Games, MD  ROS: There are no other significant problems involving Laura Irwin's other body  systems.    Objective:  Objective  Vital Signs:     BP 104/60   Pulse 84   Wt 111 lb 8 oz (50.6 kg)   BMI 19.75 kg/m  Growth %ile SmartLinks can only be used for patients less than 32 years old.   Ht Readings from Last 3 Encounters:  11/06/21 5\' 3"  (1.6 m)  10/07/20 5\' 3"  (1.6 m) (31 %, Z= -0.51)*  09/30/20 5' 2.6" (1.59 m) (25 %, Z= -0.66)*   * Growth percentiles are based on CDC (Girls, 2-20 Years) data.   Wt Readings from Last 3 Encounters:  01/21/22 111 lb 8 oz (50.6 kg)  11/06/21 108 lb (49 kg)  01/16/21 106 lb 3.2 oz (48.2 kg) (10 %, Z= -1.31)*   * Growth percentiles are based on CDC (Girls, 2-20 Years) data.   HC Readings from Last 3 Encounters:  No data found for [facility]   Body surface area is 1.5 meters squared. Facility age limit for growth %iles is 20 years. Facility age limit for growth %iles is 20 years.   PHYSICAL EXAM:   Physical Exam Constitutional:      Appearance: Normal appearance. She is normal weight.  HENT:     Head: Normocephalic and atraumatic.     Right Ear: External ear normal.     Left Ear: External ear normal.     Nose: Nose normal.     Mouth/Throat:     Mouth: Mucous membranes are moist.  Eyes:     Extraocular Movements: Extraocular movements intact.     Conjunctiva/sclera: Conjunctivae normal.  Neck:     Thyroid: No thyromegaly or thyroid tenderness.  Cardiovascular:     Rate and Rhythm: Normal rate and regular rhythm.     Pulses: Normal pulses.     Heart sounds: Normal heart sounds.  Pulmonary:     Effort: Pulmonary effort is normal.     Breath sounds: Normal breath sounds.  Abdominal:     Palpations: Abdomen is soft.  Musculoskeletal:        General: Normal range of motion.     Cervical back: Normal range of motion.  Skin:    General: Skin is warm and dry.     Capillary Refill: Capillary refill takes less than 2 seconds.  Neurological:     General: No focal deficit present.     Mental Status: She is alert and oriented to  person, place, and time.  Psychiatric:        Behavior: Behavior normal.        Thought Content: Thought content normal.    LAB DATA:    Lab Results  Component Value Date   TSH 3.34 01/19/2022   TSH 8.04 (H) 10/10/2021   TSH 2.37 01/14/2021   TSH 3.85 12/15/2019   TSH 0.67 12/14/2018   TSH 0.56 11/11/2017   Lab Results  Component Value Date   FREET4 1.5 (H) 01/19/2022   FREET4 1.5 (H) 10/10/2021  FREET4 1.7 (H) 01/14/2021   FREET4 1.7 (H) 12/15/2019   FREET4 1.6 (H) 12/14/2018   FREET4 1.5 (H) 11/11/2017     Results for orders placed or performed in visit on 01/14/22  TSH  Result Value Ref Range   TSH 3.34 mIU/L  T4, free  Result Value Ref Range   Free T4 1.5 (H) 0.8 - 1.4 ng/dL       Assessment and Plan:   ASSESSMENT: Lylith is a 21 y.o. female referred for growth delay and hypothyroidism.     Hypothyroidism - Clinically and chemically euthyroid on 125 of levothyroxine daily with an extra 62.5 mcg once a week.  - TSH is now at target - Free T4 is stable.    Anxiety - Taking Atarax 20 mg at bedtime as needed - She has not needed to take any during the day.   PLAN:   1. Diagnostic: Labs as above. Repeat TFTs prior to next visit  2. Therapeutic: Continue levothyroxine 125 daily with 0.5 tab extra per week. Atarax as above 3. Patient education:  Discussion as above.   4. Follow-up: Return in about 6 months (around 07/23/2022).      Dessa Phi, MD  Level of Service:  Level 3

## 2022-04-06 ENCOUNTER — Other Ambulatory Visit (INDEPENDENT_AMBULATORY_CARE_PROVIDER_SITE_OTHER): Payer: Self-pay | Admitting: Pediatric Endocrinology

## 2022-06-26 ENCOUNTER — Telehealth (INDEPENDENT_AMBULATORY_CARE_PROVIDER_SITE_OTHER): Payer: Self-pay | Admitting: Pediatric Endocrinology

## 2022-06-26 ENCOUNTER — Other Ambulatory Visit (INDEPENDENT_AMBULATORY_CARE_PROVIDER_SITE_OTHER): Payer: Self-pay | Admitting: Pediatric Endocrinology

## 2022-06-26 DIAGNOSIS — E063 Autoimmune thyroiditis: Secondary | ICD-10-CM

## 2022-06-26 NOTE — Telephone Encounter (Signed)
Returned call to patient to let her know that Dr. Baldo Ash ordered labs to [facility].  She thinks it is a [facility] on [address] but isn't sure.  I told her I will send the request by mychart for her to have on hand in case it is not. She asked If it was ok to go Monday to get labs, I told her that will be fine, that should give them time to result by her appt.

## 2022-06-26 NOTE — Telephone Encounter (Signed)
I put labs in for her

## 2022-06-26 NOTE — Telephone Encounter (Signed)
  Name of who is calling: Bradly Bienenstock  Caller's Relationship to Patient:  Best contact number: [phone]  Provider they see: Baldo Ash  Reason for call: Patient was inquiring if they have to get labs done before their appointment on 07/02/2022.      PRESCRIPTION REFILL ONLY  Name of prescription:  Pharmacy:

## 2022-06-26 NOTE — Telephone Encounter (Signed)
A user error has taken place: encounter opened in error, closed for administrative reasons.

## 2022-06-27 ENCOUNTER — Other Ambulatory Visit (INDEPENDENT_AMBULATORY_CARE_PROVIDER_SITE_OTHER): Payer: Self-pay | Admitting: Pediatric Endocrinology

## 2022-06-29 DIAGNOSIS — E063 Autoimmune thyroiditis: Secondary | ICD-10-CM | POA: Diagnosis not present

## 2022-06-30 LAB — T4, FREE: Free T4: 1.4 ng/dL (ref 0.8–1.8)

## 2022-06-30 LAB — TSH: TSH: 6.09 mIU/L — ABNORMAL HIGH

## 2022-07-02 ENCOUNTER — Ambulatory Visit (INDEPENDENT_AMBULATORY_CARE_PROVIDER_SITE_OTHER): Payer: BC Managed Care – PPO | Admitting: Pediatric Endocrinology

## 2022-07-02 ENCOUNTER — Encounter (INDEPENDENT_AMBULATORY_CARE_PROVIDER_SITE_OTHER): Payer: Self-pay | Admitting: Pediatric Endocrinology

## 2022-07-02 ENCOUNTER — Encounter: Payer: Self-pay | Admitting: Family Medicine

## 2022-07-02 VITALS — BP 116/70 | HR 84 | Wt 118.6 lb

## 2022-07-02 DIAGNOSIS — E063 Autoimmune thyroiditis: Secondary | ICD-10-CM

## 2022-07-02 MED ORDER — LEVOTHYROXINE SODIUM 137 MCG PO TABS
137.0000 ug | ORAL_TABLET | Freq: Every day | ORAL | 3 refills | Status: DC
Start: 2022-07-02 — End: 2022-09-28

## 2022-07-02 NOTE — Patient Instructions (Signed)
   Many adult providers feel comfortable with thyroid medication. Please ask Dr. Madilyn Fireman if she is willing to manage your thyroid or if she would prefer that you see a specialist.   I can see you again in 6 months- but please let me know what your doctor says as the wait for adult endocrine can be 6 months.

## 2022-07-02 NOTE — Progress Notes (Signed)
Subjective:  Subjective  Patient Name: Laura Irwin Date of Birth: [birth year]  MRN: [id number]  Laura Irwin  presents to the office today for follow-up evaluation and management of her  hypothyroidism and growth delay.  HISTORY OF PRESENT ILLNESS:   Laura Irwin is a 22 y.o. Caucasian female.  Laura Irwin was unaccompanied  1. Laura Irwin was diagnosed with hypothyroidism in May 2013 after presenting with vomiting, jaundice, and swelling of her neck and face, found to have TSH elevated >1069mIU/mL. At that time, she was started on Synthroid at that time. Her TPO antibodies were positive at 92.2.   2. Laura Irwin last [facility] visit was on 01/21/22.  In the interim, she has been doing well.    She has continued on 125 mcg of Synthroid with an extra 1/2 tab on Fridays. She had her labs drawn on Monday.   She is sleeping 6-9 hours a night. She overall feels more tired. She is not sure how much of that is school vs thyroid vs winter vs life.   She is always cold- but she doesn't think she is any more cold than usual.   Anxiety is "okay". She has not needed to take the Atarax since last semester. She says that it really helped and she appreciates that she only needs to take it when she feels symptomatic and she doesn't have to take it all the time.   She feels that her cycles have been more regular over the past 3-4 months.   No changes with hair or skin.  No issues with diarrhea or constipation.   3. Pertinent Review of Systems:  Constitutional: The patient feels "good/tired". The patient seems healthy and active. Eyes: Vision seems to be good. There are no recognized eye problems. Wears glasses to see the board at school- is wearing them more often.  Neck: The patient has no complaints of anterior neck swelling, soreness, tenderness, pressure, discomfort, or difficulty swallowing.   Heart: Heart rate increases with exercise or other physical activity. The patient has no complaints of palpitations, irregular  heart beats, chest pain, or chest pressure.   Gastrointestinal: Has problems with constipation, using Miralax irregularly. The patient has no complaints of excessive hunger, acid reflux, upset stomach, or diarrhea.  Legs: Muscle mass and strength seem normal. There are no complaints of numbness, tingling, burning, or pain. No edema is noted.  Feet: There are no obvious foot problems. There are no complaints of numbness, tingling, burning, or pain. No edema is noted. Neurologic: There are no recognized problems with muscle movement and strength, sensation, or coordination. GYN/GU: per HPI. LMP ~3 weeks ago  PAST MEDICAL, FAMILY, AND SOCIAL HISTORY  Past Medical History:  Diagnosis Date   Hypothyroidism     Family History  Problem Relation Age of Onset   Thyroid disease Maternal Grandmother    Thyroid disease Maternal Aunt      Current Outpatient Medications:    hydrOXYzine (ATARAX) 10 MG tablet, Take 1 tablet (10 mg total) by mouth 3 (three) times daily as needed for anxiety. (Patient not taking: Reported on 07/02/2022), Disp: 30 tablet, Rfl: 0   levothyroxine (SYNTHROID) 137 MCG tablet, Take 1 tablet (137 mcg total) by mouth daily., Disp: 90 tablet, Rfl: 3  Allergies as of 07/02/2022 - Review Complete 07/02/2022  Allergen Reaction Noted   Amoxicillin     Cephalosporins     Penicillins  10/23/2021   Minocycline Rash 06/09/2016     reports that she has never smoked. She has never  used smokeless tobacco. She reports that she does not drink alcohol and does not use drugs. Pediatric History  Patient Parents   Baksh,[name]   Other Topics Concern   Not on file  Social History Narrative    Lives with parents and brother.    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    Objective:  Objective  Vital Signs:     BP 116/70    Pulse 84   Wt 118 lb 9.6 oz (53.8 kg)   BMI 21.01 kg/m  Growth %ile SmartLinks can only be used for patients less than 64 years old.   Ht Readings from Last 3 Encounters:  11/06/21 5\' 3"  (1.6 m)  10/07/20 5\' 3"  (1.6 m) (31 %, Z= -0.51)*  09/30/20 5' 2.6" (1.59 m) (25 %, Z= -0.66)*   * Growth percentiles are based on CDC (Girls, 2-20 Years) data.   Wt Readings from Last 3 Encounters:  07/02/22 118 lb 9.6 oz (53.8 kg)  01/21/22 111 lb 8 oz (50.6 kg)  11/06/21 108 lb (49 kg)   HC Readings from Last 3 Encounters:  No data found for [facility]   Body surface area is 1.55 meters squared. Facility age limit for growth %iles is 20 years. Facility age limit for growth %iles is 20 years.   PHYSICAL EXAM:     Physical Exam Constitutional:      Appearance: Normal appearance. She is normal weight.  HENT:     Head: Normocephalic and atraumatic.     Right Ear: External ear normal.     Left Ear: External ear normal.     Nose: Nose normal.     Mouth/Throat:     Mouth: Mucous membranes are moist.  Eyes:     Extraocular Movements: Extraocular movements intact.     Conjunctiva/sclera: Conjunctivae normal.  Neck:     Thyroid: No thyromegaly or thyroid tenderness.  Cardiovascular:     Rate and Rhythm: Normal rate and regular rhythm.     Pulses: Normal pulses.     Heart sounds: Normal heart sounds.  Pulmonary:     Effort: Pulmonary effort is normal.     Breath sounds: Normal breath sounds.  Abdominal:     Palpations: Abdomen is soft.  Musculoskeletal:        General: Normal range of motion.     Cervical back: Normal range of motion.  Skin:    General: Skin is warm and dry.     Capillary Refill: Capillary refill takes less than 2 seconds.  Neurological:     General: No focal deficit present.     Mental Status: She is alert and oriented to person, place, and time.  Psychiatric:        Behavior: Behavior normal.        Thought Content: Thought content normal.     LAB DATA:    Lab  Results  Component Value Date   TSH 6.09 (H) 06/29/2022   TSH 3.34 01/19/2022   TSH 8.04 (H) 10/10/2021   TSH 2.37 01/14/2021   TSH 3.85 12/15/2019   TSH 0.67 12/14/2018   Lab Results  Component Value Date   FREET4 1.4 06/29/2022   FREET4 1.5 (H) 01/19/2022   FREET4 1.5 (H) 10/10/2021   FREET4 1.7 (H) 01/14/2021   FREET4 1.7 (H) 12/15/2019   FREET4 1.6 (H)  12/14/2018     Results for orders placed or performed in visit on 06/26/22  TSH  Result Value Ref Range   TSH 6.09 (H) mIU/L  T4, free  Result Value Ref Range   Free T4 1.4 0.8 - 1.8 ng/dL       Assessment and Plan:   ASSESSMENT: Marvena is a 22 y.o. female referred for growth delay and hypothyroidism.    Hypothyroidism - Currently taking 125 of levothyroxine daily with an extra 62.5 mcg once a week.  - TSH has increased above normal target range - Her current dose works out to 134 mcg per day - Will switch to a 137 mcg tab so that she is taking the same dose every day - Free T4 is stable.  - She may return here or have follow up with her FP PCP.    Anxiety - Taking Atarax 20 mg at bedtime as needed - She has not needed to take any during the day.   PLAN:   1. Diagnostic: Labs as above. Repeat TFTs prior to next visit  2. Therapeutic:  Meds ordered this encounter  Medications   levothyroxine (SYNTHROID) 137 MCG tablet    Sig: Take 1 tablet (137 mcg total) by mouth daily.    Dispense:  90 tablet    Refill:  3   Atarax as above 3. Patient education:  Discussion as above.   4. Follow-up: Return in about 6 months (around 12/31/2022).      Lelon Huh, MD  Level of Service:  Level 3

## 2022-07-07 ENCOUNTER — Encounter (INDEPENDENT_AMBULATORY_CARE_PROVIDER_SITE_OTHER): Payer: Self-pay | Admitting: Pediatric Endocrinology

## 2022-07-23 ENCOUNTER — Ambulatory Visit (INDEPENDENT_AMBULATORY_CARE_PROVIDER_SITE_OTHER): Payer: BC Managed Care – PPO | Admitting: Pediatric Endocrinology

## 2022-08-07 ENCOUNTER — Other Ambulatory Visit (INDEPENDENT_AMBULATORY_CARE_PROVIDER_SITE_OTHER): Payer: Self-pay | Admitting: Pediatric Endocrinology

## 2022-08-07 MED ORDER — HYDROXYZINE HCL 10 MG PO TABS: 10.0000 mg | ORAL_TABLET | Freq: Three times a day (TID) | ORAL | 0 refills | Status: AC | PRN

## 2022-09-28 ENCOUNTER — Other Ambulatory Visit (HOSPITAL_COMMUNITY)
Admission: RE | Admit: 2022-09-28 | Discharge: 2022-09-28 | Disposition: A | Discharge status: Home / Self Care | Payer: BC Managed Care – PPO | Source: Ambulatory Visit | Attending: Family Medicine | Admitting: Family Medicine

## 2022-09-28 ENCOUNTER — Encounter: Payer: Self-pay | Admitting: Family Medicine

## 2022-09-28 ENCOUNTER — Ambulatory Visit (INDEPENDENT_AMBULATORY_CARE_PROVIDER_SITE_OTHER): Payer: BC Managed Care – PPO | Admitting: Family Medicine

## 2022-09-28 VITALS — BP 128/73 | HR 88 | Ht 63.0 in | Wt 120.0 lb

## 2022-09-28 DIAGNOSIS — Z113 Encounter for screening for infections with a predominantly sexual mode of transmission: Secondary | ICD-10-CM

## 2022-09-28 DIAGNOSIS — Z124 Encounter for screening for malignant neoplasm of cervix: Secondary | ICD-10-CM

## 2022-09-28 DIAGNOSIS — Z0000 Encounter for general adult medical examination without abnormal findings: Secondary | ICD-10-CM | POA: Diagnosis not present

## 2022-09-28 DIAGNOSIS — E063 Autoimmune thyroiditis: Secondary | ICD-10-CM

## 2022-09-28 DIAGNOSIS — Z111 Encounter for screening for respiratory tuberculosis: Secondary | ICD-10-CM | POA: Diagnosis not present

## 2022-09-28 LAB — CBC WITH DIFFERENTIAL/PLATELET
Eosinophils Absolute: 89 cells/uL (ref 15–500)
Lymphs Abs: 2427 cells/uL (ref 850–3900)
MPV: 11.9 fL (ref 7.5–12.5)
Platelets: 294 10*3/uL (ref 140–400)

## 2022-09-28 MED ORDER — LEVOTHYROXINE SODIUM 137 MCG PO TABS
137.0000 ug | ORAL_TABLET | Freq: Every day | ORAL | 3 refills | Status: DC
Start: 2022-09-28 — End: 2023-04-02

## 2022-09-28 NOTE — Addendum Note (Signed)
Addended by: Deno Etienne on: 09/28/2022 11:35 AM   Modules accepted: Orders

## 2022-09-28 NOTE — Progress Notes (Signed)
Complete physical exam  Patient: Laura Irwin   DOB: 08-30-2000   21 y.o. Female  MRN: [id number]  Subjective:    Chief Complaint  Patient presents with   Annual Exam    Laura Irwin is a 22 y.o. female who presents today for a complete physical exam. She reports consuming a general diet.  Works out regularly. 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 Brought in form today.   She generally feels well. Sleeping well.   She does not have additional problems to discuss today. She is sexually  active.     Most recent fall risk assessment:    09/28/2022   10:05 AM  Fall Risk   Falls in the past year? 0  Number falls in past yr: 0  Injury with Fall? 0  Risk for fall due to : No Fall Risks  Follow up Falls evaluation completed     Most recent depression screenings:    11/06/2021    9:56 AM 10/07/2020    2:59 PM  PHQ 2/9 Scores  PHQ - 2 Score 0 0  PHQ- 9 Score 2         Patient Care Team: Agapito Games, MD as PCP - General   Outpatient Medications Prior to Visit  Medication Sig   hydrOXYzine (ATARAX) 10 MG tablet Take 1 tablet (10 mg total) by mouth 3 (three) times daily as needed for anxiety.   [DISCONTINUED] levothyroxine (SYNTHROID) 137 MCG tablet Take 1 tablet (137 mcg total) by mouth daily.   No facility-administered medications prior to visit.    ROS        Objective:     BP 128/73   Pulse 88   Ht 5\' 3"  (1.6 m)   Wt 120 lb (54.4 kg)   LMP 09/09/2022 (Approximate)   SpO2 96%   BMI 21.26 kg/m    Physical Exam Vitals and nursing note reviewed.  Constitutional:      Appearance: She is well-developed.  HENT:     Head: Normocephalic and atraumatic.     Right Ear: External ear normal.     Left Ear: External ear normal.     Nose: Nose normal.     Mouth/Throat:     Pharynx: Oropharynx is clear.  Eyes:     Conjunctiva/sclera: Conjunctivae normal.     Pupils: Pupils are equal, round, and reactive to light.  Neck:     Thyroid: No  thyromegaly.  Cardiovascular:     Rate and Rhythm: Normal rate and regular rhythm.     Heart sounds: Normal heart sounds.  Pulmonary:     Effort: Pulmonary effort is normal.     Breath sounds: Normal breath sounds. No wheezing.  Abdominal:     General: Bowel sounds are normal.     Palpations: Abdomen is soft.  Musculoskeletal:     Cervical back: Neck supple.  Lymphadenopathy:     Cervical: No cervical adenopathy.  Skin:    General: Skin is warm and dry.  Neurological:     Mental Status: She is alert and oriented to person, place, and time.  Psychiatric:        Behavior: Behavior normal.      No results found for any visits on 09/28/22.     Assessment & Plan:    Routine Health Maintenance and Physical Exam  Immunization History  Administered Date(s) Administered   DTaP 07/12/2001, 08/23/2001, 11/24/2001, 04/24/2002, 09/27/2006   HIB (PRP-OMP) 07/12/2001,  08/23/2001, 11/24/2001, 04/24/2002   HPV 9-valent 08/27/2014, 12/17/2014   HPV Quadrivalent 06/01/2014   Hepatitis A, Ped/Adol-2 Dose 06/01/2014, 12/17/2014   Hepatitis B 04/29/2001, 08/23/2001, 11/24/2001   IPV 07/12/2001, 08/23/2001, 12/24/2002, 11/01/2006   Influenza Split 03/12/2003, 05/01/2003   Influenza-Unspecified 06/25/2020   MMR 05/01/2003, 12/29/2006   Meningococcal B, OMV 06/15/2018, 07/18/2018   Meningococcal Conjugate 04/28/2012   Meningococcal Mcv4o 06/15/2018   PFIZER(Purple Top)SARS-COV-2 Vaccination 01/05/2020, 01/26/2020   Pneumococcal Conjugate-13 07/12/2001, 08/23/2001, 11/24/2001   Tdap 04/28/2012   Varicella 04/24/2002, 11/01/2006    Health Maintenance  Topic Date Due   HIV Screening  Never done   COVID-19 Vaccine (3 - 2023-24 season) 01/23/2022   PAP-Cervical Cytology Screening  Never done   PAP SMEAR-Modifier  Never done   DTaP/Tdap/Td (7 - Td or Tdap) 04/28/2022   INFLUENZA VACCINE  12/24/2022   HPV VACCINES  Completed   Hepatitis C Screening  Completed   Pneumococcal Vaccine 19-44  Years old  Aged Out    Discussed health benefits of physical activity, and encouraged her to engage in regular exercise appropriate for her age and condition.  Problem List Items Addressed This Visit       Endocrine   Hypothyroidism, acquired, autoimmune   Relevant Medications   levothyroxine (SYNTHROID) 137 MCG tablet   Other Visit Diagnoses     Wellness examination    -  Primary   Relevant Orders   COMPLETE METABOLIC PANEL WITH GFR   TSH   T4, free   CBC with Differential/Platelet   QuantiFERON-TB Gold Plus   Screening-pulmonary TB       Relevant Orders   QuantiFERON-TB Gold Plus      Return in about 6 months (around 03/31/2023) for thyroid .     Nani Gasser, MD

## 2022-09-29 LAB — COMPLETE METABOLIC PANEL WITH GFR
Alkaline phosphatase (APISO): 58 U/L (ref 31–125)
CO2: 27 mmol/L (ref 20–32)
Creat: 0.89 mg/dL (ref 0.50–0.96)
Glucose, Bld: 78 mg/dL (ref 65–99)
Potassium: 4.4 mmol/L (ref 3.5–5.3)
Sodium: 138 mmol/L (ref 135–146)
Total Bilirubin: 1.1 mg/dL (ref 0.2–1.2)
Total Protein: 7.3 g/dL (ref 6.1–8.1)
eGFR: 95 mL/min/{1.73_m2} (ref >=60)

## 2022-09-29 LAB — CBC WITH DIFFERENTIAL/PLATELET
HCT: 41 % (ref 35.0–45.0)
MCH: 28.9 pg (ref 27.0–33.0)
MCHC: 32.7 g/dL (ref 32.0–36.0)
Monocytes Relative: 7.4 %
Neutrophils Relative %: 58.5 %

## 2022-09-29 LAB — TSH: TSH: 3.93 mIU/L

## 2022-09-29 NOTE — Progress Notes (Signed)
Labs including thyroid look good. Let me know if you need refills. Plan to recheck Thyroid in 3 months.

## 2022-10-01 ENCOUNTER — Encounter: Payer: Self-pay | Admitting: Family Medicine

## 2022-10-01 LAB — CBC WITH DIFFERENTIAL/PLATELET
Absolute Monocytes: 548 cells/uL (ref 200–950)
Basophils Absolute: 7 cells/uL (ref 0–200)
Basophils Relative: 0.1 %
Eosinophils Relative: 1.2 %
Hemoglobin: 13.4 g/dL (ref 11.7–15.5)
MCV: 88.4 fL (ref 80.0–100.0)
Neutro Abs: 4329 cells/uL (ref 1500–7800)
RBC: 4.64 10*6/uL (ref 3.80–5.10)
RDW: 11.7 % (ref 11.0–15.0)
Total Lymphocyte: 32.8 %
WBC: 7.4 10*3/uL (ref 3.8–10.8)

## 2022-10-01 LAB — CYTOLOGY - PAP
Chlamydia: NEGATIVE
Comment: NEGATIVE
Comment: NEGATIVE
Comment: NORMAL
Diagnosis: NEGATIVE
Diagnosis: REACTIVE
High risk HPV: NEGATIVE
Neisseria Gonorrhea: NEGATIVE

## 2022-10-01 LAB — COMPLETE METABOLIC PANEL WITH GFR
AG Ratio: 1.6 (calc) (ref 1.0–2.5)
ALT: 12 U/L (ref 6–29)
AST: 12 U/L (ref 10–30)
Albumin: 4.5 g/dL (ref 3.6–5.1)
BUN: 21 mg/dL (ref 7–25)
Calcium: 10 mg/dL (ref 8.6–10.2)
Chloride: 103 mmol/L (ref 98–110)
Globulin: 2.8 g/dL (calc) (ref 1.9–3.7)

## 2022-10-01 LAB — QUANTIFERON-TB GOLD PLUS
Mitogen-NIL: 7.77 IU/mL
NIL: 0.02 IU/mL
QuantiFERON-TB Gold Plus: NEGATIVE
TB1-NIL: 0.01 IU/mL
TB2-NIL: 0.01 IU/mL

## 2022-10-01 LAB — T4, FREE: Free T4: 1.5 ng/dL (ref 0.8–1.8)

## 2022-10-01 MED ORDER — FLUCONAZOLE 150 MG PO TABS: 150.0000 mg | ORAL_TABLET | Freq: Once | ORAL | 0 refills | Status: AC

## 2022-10-01 NOTE — Progress Notes (Signed)
HI Argusta,  Your Pap smear cells look normal which is great so we will just need to repeat your Pap smear in 3 years.  You are negative for gonorrhea and chlamydia.  Did notice a few yeast spores so I am going to send over prescription for Diflucan to clear that up.

## 2022-10-01 NOTE — Addendum Note (Signed)
Addended by: Nani Gasser D on: 10/01/2022 09:29 AM   Modules accepted: Orders

## 2022-10-02 ENCOUNTER — Other Ambulatory Visit: Payer: Self-pay

## 2022-10-02 DIAGNOSIS — E063 Autoimmune thyroiditis: Secondary | ICD-10-CM

## 2022-10-02 NOTE — Progress Notes (Signed)
Hi Laura Irwin, thyroid looks back on track at 3.9.  Recommend rechecking again in 3 months.  We can always just place a lab order and you can come by the lab around that time.  Your blood count metabolic panel are normal.  Negative for TB.

## 2022-11-27 ENCOUNTER — Encounter (INDEPENDENT_AMBULATORY_CARE_PROVIDER_SITE_OTHER): Payer: Self-pay

## 2023-01-06 ENCOUNTER — Ambulatory Visit (INDEPENDENT_AMBULATORY_CARE_PROVIDER_SITE_OTHER): Payer: BC Managed Care – PPO | Admitting: Pediatric Endocrinology

## 2023-03-30 ENCOUNTER — Encounter: Payer: Self-pay | Admitting: Family Medicine

## 2023-03-30 DIAGNOSIS — E063 Autoimmune thyroiditis: Secondary | ICD-10-CM

## 2023-03-31 DIAGNOSIS — E063 Autoimmune thyroiditis: Secondary | ICD-10-CM | POA: Diagnosis not present

## 2023-04-01 ENCOUNTER — Encounter: Payer: Self-pay | Admitting: Family Medicine

## 2023-04-01 LAB — TSH: TSH: 10.3 u[IU]/mL — ABNORMAL HIGH (ref 0.450–4.500)

## 2023-04-01 NOTE — Progress Notes (Signed)
Hi Laura Irwin,  Your TSH has trended upward so that means we need to make an adjustment to your dose.  Just verify with me exactly how you are taking it.  And let me know if you did miss any doses or run out of medication recently.

## 2023-04-02 ENCOUNTER — Ambulatory Visit: Payer: BC Managed Care – PPO | Admitting: Family Medicine

## 2023-04-02 ENCOUNTER — Encounter: Payer: Self-pay | Admitting: Family Medicine

## 2023-04-02 VITALS — BP 111/75 | HR 83 | Resp 20 | Ht 63.0 in | Wt 124.1 lb

## 2023-04-02 DIAGNOSIS — E063 Autoimmune thyroiditis: Secondary | ICD-10-CM

## 2023-04-02 MED ORDER — LEVOTHYROXINE SODIUM 150 MCG PO TABS: 150.0000 ug | ORAL_TABLET | Freq: Every day | ORAL | 0 refills | Status: DC

## 2023-04-02 NOTE — Progress Notes (Signed)
   Established Patient Office Visit  Subjective   Patient ID: Laura Irwin, female    DOB: 2000/11/17  Age: 22 y.o. MRN: [id number]  Chief Complaint  Patient presents with   Medical Management of Chronic Issues    THYROID FOLLOW UP    HPI  F/U hypothyroidism - taking med in am by itself and then eats about 30 min later.  She is taking her medication very consistently she says she always sits about the night before so she will remember.  Lab Results  Component Value Date   TSH 10.300 (H) 03/31/2023   Should be finishing up her program in May.  She will to her capstone in the spring.  She is otherwise doing well.    ROS    Objective:     BP 111/75 (BP Location: Left Arm, Cuff Size: Normal)   Pulse 83   Resp 20   Ht 5\' 3"  (1.6 m)   Wt 124 lb 1.3 oz (56.3 kg)   SpO2 99%   BMI 21.98 kg/m    Physical Exam Vitals and nursing note reviewed.  Constitutional:      Appearance: Normal appearance.  HENT:     Head: Normocephalic and atraumatic.  Eyes:     Conjunctiva/sclera: Conjunctivae normal.  Cardiovascular:     Rate and Rhythm: Normal rate and regular rhythm.  Pulmonary:     Effort: Pulmonary effort is normal.     Breath sounds: Normal breath sounds.  Skin:    General: Skin is warm and dry.  Neurological:     Mental Status: She is alert.  Psychiatric:        Mood and Affect: Mood normal.      No results found for any visits on 04/02/23.    The ASCVD Risk score (Arnett DK, et al., 2019) failed to calculate for the following reasons:   The 2019 ASCVD risk score is only valid for ages 24 to 59    Assessment & Plan:   Problem List Items Addressed This Visit       Endocrine   Hypothyroidism, acquired, autoimmune - Primary    TSH 10.  Will increase levothyroxine from 137 mcg daily to 150 mcg daily.  New prescription sent to pharmacy.  Recommend repeat labs in 6 to 8 weeks.  Lab slip provided.  If she is otherwise doing well I will see her back in 6  months.  We can always make adjustments via MyChart if needed.      Relevant Medications   levothyroxine (SYNTHROID) 150 MCG tablet   Other Relevant Orders   TSH    Return in about 6 months (around 10/07/2023) for thyroid .    Nani Gasser, MD

## 2023-04-02 NOTE — Assessment & Plan Note (Signed)
TSH 10.  Will increase levothyroxine from 137 mcg daily to 150 mcg daily.  New prescription sent to pharmacy.  Recommend repeat labs in 6 to 8 weeks.  Lab slip provided.  If she is otherwise doing well I will see her back in 6 months.  We can always make adjustments via MyChart if needed.

## 2023-05-27 DIAGNOSIS — E063 Autoimmune thyroiditis: Secondary | ICD-10-CM | POA: Diagnosis not present

## 2023-05-28 LAB — TSH: TSH: 2.29 u[IU]/mL (ref 0.450–4.500)

## 2023-06-26 ENCOUNTER — Other Ambulatory Visit: Payer: Self-pay | Admitting: Family Medicine

## 2023-06-26 DIAGNOSIS — E063 Autoimmune thyroiditis: Secondary | ICD-10-CM

## 2023-06-29 ENCOUNTER — Other Ambulatory Visit: Payer: Self-pay | Admitting: Family Medicine

## 2023-06-29 DIAGNOSIS — E063 Autoimmune thyroiditis: Secondary | ICD-10-CM

## 2023-06-30 ENCOUNTER — Encounter: Payer: Self-pay | Admitting: Family Medicine

## 2023-07-07 DIAGNOSIS — L821 Other seborrheic keratosis: Secondary | ICD-10-CM | POA: Diagnosis not present

## 2023-07-07 DIAGNOSIS — B079 Viral wart, unspecified: Secondary | ICD-10-CM | POA: Diagnosis not present

## 2023-09-24 ENCOUNTER — Other Ambulatory Visit: Payer: Self-pay | Admitting: Family Medicine

## 2023-09-24 DIAGNOSIS — E063 Autoimmune thyroiditis: Secondary | ICD-10-CM

## 2023-10-04 ENCOUNTER — Encounter: Payer: Self-pay | Admitting: Family Medicine

## 2023-10-06 ENCOUNTER — Encounter: Payer: Self-pay | Admitting: Family Medicine

## 2023-10-06 ENCOUNTER — Ambulatory Visit (INDEPENDENT_AMBULATORY_CARE_PROVIDER_SITE_OTHER): Payer: BC Managed Care – PPO | Admitting: Family Medicine

## 2023-10-06 VITALS — BP 120/61 | HR 83 | Ht 63.0 in | Wt 113.1 lb

## 2023-10-06 DIAGNOSIS — Z113 Encounter for screening for infections with a predominantly sexual mode of transmission: Secondary | ICD-10-CM | POA: Diagnosis not present

## 2023-10-06 DIAGNOSIS — Z0000 Encounter for general adult medical examination without abnormal findings: Secondary | ICD-10-CM | POA: Diagnosis not present

## 2023-10-06 NOTE — Progress Notes (Signed)
 Complete physical exam  Patient: Laura Irwin   DOB: 12/01/2000   22 y.o. Female  MRN: [id number]  Subjective:     Chief Complaint  Patient presents with   Annual Exam    Laura Irwin is a 23 y.o. female who presents today for a complete physical exam. She reports consuming a general diet. She exercises regularly She generally feels well. She reports sleeping well. She does not have additional problems to discuss today.  She did see derm this year for wart on foot.    Most recent fall risk assessment:    09/28/2022   11:34 AM  Fall Risk   Falls in the past year? 0  Number falls in past yr: 0  Injury with Fall? 0  Risk for fall due to : No Fall Risks  Follow up Falls evaluation completed     Most recent depression screenings:    04/02/2023    2:39 PM 09/28/2022   11:34 AM  PHQ 2/9 Scores  PHQ - 2 Score 0 0  PHQ- 9 Score 2          Patient Care Team: Cydney Draft, MD as PCP - General   Outpatient Medications Prior to Visit  Medication Sig   hydrOXYzine  (ATARAX ) 10 MG tablet Take 1 tablet (10 mg total) by mouth 3 (three) times daily as needed for anxiety.   levothyroxine  (SYNTHROID ) 150 MCG tablet TAKE 1 TABLET BY MOUTH EVERY DAY   No facility-administered medications prior to visit.    ROS        Objective:     BP 120/61   Pulse 83   Ht 5\' 3"  (1.6 m)   Wt 113 lb 1.6 oz (51.3 kg)   SpO2 99%   BMI 20.03 kg/m     Physical Exam Vitals and nursing note reviewed.  Constitutional:      Appearance: Normal appearance.  HENT:     Head: Normocephalic and atraumatic.     Right Ear: Tympanic membrane, ear canal and external ear normal.     Left Ear: Tympanic membrane, ear canal and external ear normal.     Nose: Nose normal.     Mouth/Throat:     Pharynx: Oropharynx is clear.  Eyes:     Extraocular Movements: Extraocular movements intact.     Conjunctiva/sclera: Conjunctivae normal.     Pupils: Pupils are equal, round, and reactive to  light.  Neck:     Thyroid : No thyromegaly.  Cardiovascular:     Rate and Rhythm: Normal rate and regular rhythm.  Pulmonary:     Effort: Pulmonary effort is normal.     Breath sounds: Normal breath sounds.  Abdominal:     General: Bowel sounds are normal.     Palpations: Abdomen is soft.     Tenderness: There is no abdominal tenderness.  Musculoskeletal:        General: No swelling.     Cervical back: Neck supple.  Skin:    General: Skin is warm and dry.  Neurological:     Mental Status: She is alert and oriented to person, place, and time.  Psychiatric:        Mood and Affect: Mood normal.        Behavior: Behavior normal.      No results found for any visits on 10/06/23.      Assessment & Plan:    Routine Health Maintenance and Physical Exam  Immunization History  Administered Date(s)  Administered   DTaP 07/12/2001, 08/23/2001, 11/24/2001, 04/24/2002, 09/27/2006   HIB (PRP-OMP) 07/12/2001, 08/23/2001, 11/24/2001, 04/24/2002   HPV 9-valent 08/27/2014, 12/17/2014   HPV Quadrivalent 06/01/2014   Hepatitis A, Ped/Adol-2 Dose 06/01/2014, 12/17/2014   Hepatitis B 04/29/2001, 08/23/2001, 11/24/2001   IPV 07/12/2001, 08/23/2001, 12/24/2002, 11/01/2006   Influenza Inj Mdck Quad Pf 01/31/2022   Influenza Split 03/12/2003, 05/01/2003   Influenza, Mdck, Trivalent,PF 6+ MOS(egg free) 02/01/2023   Influenza-Unspecified 06/25/2020   MMR 05/01/2003, 12/29/2006   Meningococcal B, OMV 06/15/2018, 07/18/2018   Meningococcal Conjugate 04/28/2012   Meningococcal Mcv4o 06/15/2018   PFIZER(Purple Top)SARS-COV-2 Vaccination 01/05/2020, 01/26/2020   PPD Test 07/22/2020, 08/02/2020   Pneumococcal Conjugate-13 07/12/2001, 08/23/2001, 11/24/2001   Tdap 04/28/2012, 04/28/2022   Varicella 04/24/2002, 11/01/2006    Health Maintenance  Topic Date Due   HIV Screening  Never done   COVID-19 Vaccine (3 - 2024-25 season) 01/24/2023   INFLUENZA VACCINE  12/24/2023   Cervical Cancer  Screening (Pap smear)  09/27/2025   DTaP/Tdap/Td (8 - Td or Tdap) 04/28/2032   HPV VACCINES  Completed   Hepatitis C Screening  Completed   Meningococcal B Vaccine  Completed   Pneumococcal Vaccine 65-71 Years old  Aged Out    Discussed health benefits of physical activity, and encouraged her to engage in regular exercise appropriate for her age and condition.  Problem List Items Addressed This Visit   None Visit Diagnoses       Wellness examination    -  Primary   Relevant Orders   TSH + free T4   CMP14+EGFR   HIV antibody (with reflex)   RPR   GC/Chlamydia Probe Amp([facility])     Screening examination for STI       Relevant Orders   HIV antibody (with reflex)   RPR   GC/Chlamydia Probe Amp([facility])       Keep up a regular exercise program and make sure you are eating a healthy diet Try to eat 4 servings of dairy a day, or if you are lactose intolerant take a calcium with vitamin D daily.  Your vaccines are up to date.   No follow-ups on file.     Duaine German, MD

## 2023-10-07 ENCOUNTER — Ambulatory Visit: Payer: Self-pay | Admitting: Family Medicine

## 2023-10-07 LAB — CMP14+EGFR
ALT: 15 IU/L (ref 0–32)
AST: 14 IU/L (ref 0–40)
Albumin: 4.6 g/dL (ref 4.0–5.0)
Alkaline Phosphatase: 65 IU/L (ref 44–121)
BUN/Creatinine Ratio: 22 (ref 9–23)
BUN: 21 mg/dL — ABNORMAL HIGH (ref 6–20)
Bilirubin Total: 0.7 mg/dL (ref 0.0–1.2)
CO2: 23 mmol/L (ref 20–29)
Calcium: 10.3 mg/dL — ABNORMAL HIGH (ref 8.7–10.2)
Chloride: 101 mmol/L (ref 96–106)
Creatinine, Ser: 0.97 mg/dL (ref 0.57–1.00)
Globulin, Total: 2.6 g/dL (ref 1.5–4.5)
Glucose: 87 mg/dL (ref 70–99)
Potassium: 4.7 mmol/L (ref 3.5–5.2)
Sodium: 139 mmol/L (ref 134–144)
Total Protein: 7.2 g/dL (ref 6.0–8.5)
eGFR: 85 mL/min/{1.73_m2} (ref >59)

## 2023-10-07 LAB — TSH+FREE T4
Free T4: 1.94 ng/dL — ABNORMAL HIGH (ref 0.82–1.77)
TSH: 1.96 u[IU]/mL (ref 0.450–4.500)

## 2023-10-07 LAB — RPR: RPR Ser Ql: NONREACTIVE

## 2023-10-07 LAB — HIV ANTIBODY (ROUTINE TESTING W REFLEX): HIV Screen 4th Generation wRfx: NONREACTIVE

## 2023-10-07 NOTE — Progress Notes (Signed)
 Hi Mahkayla, , TSH actually looks really good at 1.9.  Metabolic panel overall looks good.  Calcium just 1/10 of a point elevated but that is not in a concerning or worrisome range.  Liver function looks great.  Negative for HIV and negative for syphilis.

## 2023-10-09 LAB — GC/CHLAMYDIA PROBE AMP
Chlamydia trachomatis, NAA: NEGATIVE
Neisseria Gonorrhoeae by PCR: NEGATIVE

## 2023-10-09 LAB — SPECIMEN STATUS REPORT

## 2023-10-11 NOTE — Progress Notes (Signed)
 Negative for gonorrhea and chlamydia.

## 2024-01-01 ENCOUNTER — Other Ambulatory Visit: Payer: Self-pay | Admitting: Family Medicine

## 2024-01-01 DIAGNOSIS — E063 Autoimmune thyroiditis: Secondary | ICD-10-CM

## 2024-01-03 ENCOUNTER — Encounter: Payer: Self-pay | Admitting: Family Medicine

## 2024-01-03 DIAGNOSIS — E063 Autoimmune thyroiditis: Secondary | ICD-10-CM

## 2024-01-03 DIAGNOSIS — R5383 Other fatigue: Secondary | ICD-10-CM

## 2024-01-04 DIAGNOSIS — E063 Autoimmune thyroiditis: Secondary | ICD-10-CM | POA: Diagnosis not present

## 2024-01-04 DIAGNOSIS — R5383 Other fatigue: Secondary | ICD-10-CM | POA: Diagnosis not present

## 2024-01-05 ENCOUNTER — Ambulatory Visit: Payer: Self-pay | Admitting: Family Medicine

## 2024-01-05 DIAGNOSIS — E063 Autoimmune thyroiditis: Secondary | ICD-10-CM

## 2024-01-05 LAB — TSH: TSH: 0.847 u[IU]/mL (ref 0.450–4.500)

## 2024-01-05 LAB — CBC WITH DIFFERENTIAL/PLATELET
Basophils Absolute: 0 x10E3/uL (ref 0.0–0.2)
Basos: 0 %
EOS (ABSOLUTE): 0.3 x10E3/uL (ref 0.0–0.4)
Eos: 3 %
Hematocrit: 40.9 % (ref 34.0–46.6)
Hemoglobin: 13.4 g/dL (ref 11.1–15.9)
Immature Grans (Abs): 0 x10E3/uL (ref 0.0–0.1)
Immature Granulocytes: 0 %
Lymphocytes Absolute: 2.1 x10E3/uL (ref 0.7–3.1)
Lymphs: 27 %
MCH: 30 pg (ref 26.6–33.0)
MCHC: 32.8 g/dL (ref 31.5–35.7)
MCV: 92 fL (ref 79–97)
Monocytes Absolute: 0.6 x10E3/uL (ref 0.1–0.9)
Monocytes: 7 %
Neutrophils Absolute: 4.9 x10E3/uL (ref 1.4–7.0)
Neutrophils: 63 %
Platelets: 268 x10E3/uL (ref 150–450)
RBC: 4.47 x10E6/uL (ref 3.77–5.28)
RDW: 11.9 % (ref 11.7–15.4)
WBC: 7.9 x10E3/uL (ref 3.4–10.8)

## 2024-01-05 NOTE — Progress Notes (Signed)
 HI Laura Irwin,  Blood count looks okay.  No sign of anemia which is great.  Your thyroid is technically in the normal range though this week spots between 1 and 2 so you are a little bit under that which means you are potentially just a little overmedicated.  Sometimes that can make you feel a little bit more anxious or jittery or have heart flutters or skips.  Are you noticing any symptoms like that?  We could always make a slight adjustment or we can keep it where it sat and then plan to recheck again in 6 to 8 weeks.

## 2024-01-13 NOTE — Telephone Encounter (Signed)
 TSH has been ordered

## 2024-10-09 ENCOUNTER — Encounter: Admitting: Family Medicine
